# Patient Record
Sex: Female | Born: 1990
Health system: Southern US, Community
[De-identification: ages and names within clinical notes are randomized; demographics above are authoritative.]

## PROBLEM LIST (undated history)

## (undated) DIAGNOSIS — N760 Acute vaginitis: Secondary | ICD-10-CM

## (undated) DIAGNOSIS — B9689 Other specified bacterial agents as the cause of diseases classified elsewhere: Secondary | ICD-10-CM

---
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
## 2011-04-13 LAB — CBC
HCT: 41.3
Hemoglobin: 10.1 — ABNORMAL LOW
Hemoglobin: 10.2 — ABNORMAL LOW
MCHC: 35.3
MCHC: 35.3
MCV: 91.4
Platelets: 140 — ABNORMAL LOW
RBC: 3.12 — ABNORMAL LOW
RBC: 3.2 — ABNORMAL LOW
RDW: 13.2
RDW: 13.6
WBC: 13
WBC: 8.9

## 2011-04-13 LAB — RPR: RPR Ser Ql: NONREACTIVE

## 2015-12-28 DIAGNOSIS — R112 Nausea with vomiting, unspecified: Secondary | ICD-10-CM | POA: Diagnosis not present

## 2015-12-28 DIAGNOSIS — Z331 Pregnant state, incidental: Secondary | ICD-10-CM | POA: Diagnosis not present

## 2015-12-28 DIAGNOSIS — N912 Amenorrhea, unspecified: Secondary | ICD-10-CM | POA: Diagnosis not present

## 2016-01-29 MED FILL — miSOPROStol 200 MCG TABS: 200 | 2 days supply | Qty: 8 | Fill #0

## 2016-02-12 DIAGNOSIS — Z114 Encounter for screening for human immunodeficiency virus [HIV]: Secondary | ICD-10-CM | POA: Diagnosis not present

## 2016-02-12 DIAGNOSIS — Z113 Encounter for screening for infections with a predominantly sexual mode of transmission: Secondary | ICD-10-CM | POA: Diagnosis not present

## 2016-02-12 DIAGNOSIS — Z1159 Encounter for screening for other viral diseases: Secondary | ICD-10-CM | POA: Diagnosis not present

## 2016-02-12 DIAGNOSIS — Z6824 Body mass index (BMI) 24.0-24.9, adult: Secondary | ICD-10-CM | POA: Diagnosis not present

## 2016-02-12 DIAGNOSIS — Z01419 Encounter for gynecological examination (general) (routine) without abnormal findings: Secondary | ICD-10-CM | POA: Diagnosis not present

## 2016-02-12 DIAGNOSIS — R87612 Low grade squamous intraepithelial lesion on cytologic smear of cervix (LGSIL): Secondary | ICD-10-CM | POA: Diagnosis not present

## 2016-02-13 MED FILL — XULANE PATCH: 150-35 | 28 days supply | Qty: 3 | Fill #0

## 2016-03-31 MED FILL — XULANE PATCH: 150-35 | 28 days supply | Qty: 3 | Fill #1

## 2016-06-29 DIAGNOSIS — N926 Irregular menstruation, unspecified: Secondary | ICD-10-CM | POA: Diagnosis not present

## 2016-06-29 DIAGNOSIS — Z0000 Encounter for general adult medical examination without abnormal findings: Secondary | ICD-10-CM | POA: Diagnosis not present

## 2016-06-29 DIAGNOSIS — N898 Other specified noninflammatory disorders of vagina: Secondary | ICD-10-CM | POA: Diagnosis not present

## 2016-06-29 DIAGNOSIS — E559 Vitamin D deficiency, unspecified: Secondary | ICD-10-CM | POA: Diagnosis not present

## 2016-06-29 DIAGNOSIS — N6310 Unspecified lump in the right breast, unspecified quadrant: Secondary | ICD-10-CM | POA: Diagnosis not present

## 2016-06-29 DIAGNOSIS — Z114 Encounter for screening for human immunodeficiency virus [HIV]: Secondary | ICD-10-CM | POA: Diagnosis not present

## 2016-06-29 DIAGNOSIS — Z7251 High risk heterosexual behavior: Secondary | ICD-10-CM | POA: Diagnosis not present

## 2016-06-29 DIAGNOSIS — R5383 Other fatigue: Secondary | ICD-10-CM | POA: Diagnosis not present

## 2016-06-29 MED FILL — metroNIDAZOLE 500 MG TABS: 500 | 7 days supply | Qty: 14 | Fill #0

## 2016-06-30 ENCOUNTER — Other Ambulatory Visit: Payer: Self-pay | Admitting: Nurse Practitioner

## 2016-06-30 DIAGNOSIS — N6310 Unspecified lump in the right breast, unspecified quadrant: Secondary | ICD-10-CM

## 2016-07-15 ENCOUNTER — Other Ambulatory Visit: Payer: Self-pay

## 2016-07-31 ENCOUNTER — Ambulatory Visit
Admission: RE | Admit: 2016-07-31 | Discharge: 2016-07-31 | Disposition: A | Discharge status: Home / Self Care | Payer: 59 | Source: Ambulatory Visit | Attending: Nurse Practitioner | Admitting: Nurse Practitioner

## 2016-07-31 ENCOUNTER — Other Ambulatory Visit: Payer: Self-pay | Admitting: Nurse Practitioner

## 2016-07-31 DIAGNOSIS — N6310 Unspecified lump in the right breast, unspecified quadrant: Secondary | ICD-10-CM

## 2016-08-04 ENCOUNTER — Ambulatory Visit
Admission: RE | Admit: 2016-08-04 | Discharge: 2016-08-04 | Disposition: A | Discharge status: Home / Self Care | Payer: 59 | Source: Ambulatory Visit | Attending: Nurse Practitioner | Admitting: Nurse Practitioner

## 2016-08-04 ENCOUNTER — Other Ambulatory Visit: Payer: Self-pay | Admitting: Nurse Practitioner

## 2016-08-04 DIAGNOSIS — N6310 Unspecified lump in the right breast, unspecified quadrant: Secondary | ICD-10-CM | POA: Diagnosis not present

## 2016-08-04 DIAGNOSIS — D241 Benign neoplasm of right breast: Secondary | ICD-10-CM | POA: Diagnosis not present

## 2016-08-12 DIAGNOSIS — D241 Benign neoplasm of right breast: Secondary | ICD-10-CM | POA: Diagnosis not present

## 2016-08-31 DIAGNOSIS — Z3201 Encounter for pregnancy test, result positive: Secondary | ICD-10-CM | POA: Diagnosis not present

## 2016-09-04 DIAGNOSIS — Z3682 Encounter for antenatal screening for nuchal translucency: Secondary | ICD-10-CM | POA: Diagnosis not present

## 2016-09-04 DIAGNOSIS — Z3491 Encounter for supervision of normal pregnancy, unspecified, first trimester: Secondary | ICD-10-CM | POA: Diagnosis not present

## 2016-09-04 DIAGNOSIS — Z36 Encounter for antenatal screening for chromosomal anomalies: Secondary | ICD-10-CM | POA: Diagnosis not present

## 2016-09-04 DIAGNOSIS — Z3201 Encounter for pregnancy test, result positive: Secondary | ICD-10-CM | POA: Diagnosis not present

## 2016-10-15 DIAGNOSIS — O34219 Maternal care for unspecified type scar from previous cesarean delivery: Secondary | ICD-10-CM | POA: Diagnosis not present

## 2016-10-15 DIAGNOSIS — Z880 Allergy status to penicillin: Secondary | ICD-10-CM | POA: Diagnosis not present

## 2016-10-15 DIAGNOSIS — O0932 Supervision of pregnancy with insufficient antenatal care, second trimester: Secondary | ICD-10-CM | POA: Diagnosis not present

## 2016-10-15 DIAGNOSIS — Z3A18 18 weeks gestation of pregnancy: Secondary | ICD-10-CM | POA: Diagnosis not present

## 2016-10-22 DIAGNOSIS — Z3A19 19 weeks gestation of pregnancy: Secondary | ICD-10-CM | POA: Diagnosis not present

## 2016-10-22 DIAGNOSIS — O039 Complete or unspecified spontaneous abortion without complication: Secondary | ICD-10-CM | POA: Diagnosis not present

## 2016-10-22 DIAGNOSIS — O4290 Premature rupture of membranes, unspecified as to length of time between rupture and onset of labor, unspecified weeks of gestation: Secondary | ICD-10-CM | POA: Diagnosis not present

## 2016-10-22 DIAGNOSIS — Z880 Allergy status to penicillin: Secondary | ICD-10-CM | POA: Diagnosis not present

## 2016-10-23 DIAGNOSIS — O42012 Preterm premature rupture of membranes, onset of labor within 24 hours of rupture, second trimester: Secondary | ICD-10-CM | POA: Diagnosis not present

## 2016-10-23 DIAGNOSIS — O4290 Premature rupture of membranes, unspecified as to length of time between rupture and onset of labor, unspecified weeks of gestation: Secondary | ICD-10-CM | POA: Diagnosis not present

## 2016-10-23 DIAGNOSIS — O039 Complete or unspecified spontaneous abortion without complication: Secondary | ICD-10-CM | POA: Diagnosis not present

## 2016-10-23 DIAGNOSIS — Z3A19 19 weeks gestation of pregnancy: Secondary | ICD-10-CM | POA: Diagnosis not present

## 2016-10-23 DIAGNOSIS — Z880 Allergy status to penicillin: Secondary | ICD-10-CM | POA: Diagnosis not present

## 2016-10-26 MED FILL — MEDROXYPROG 150 MG/ML SYR: 150 | 84 days supply | Qty: 1 | Fill #0

## 2017-06-12 ENCOUNTER — Encounter (HOSPITAL_COMMUNITY): Payer: Self-pay | Admitting: *Deleted

## 2017-06-12 ENCOUNTER — Other Ambulatory Visit: Payer: Self-pay

## 2017-06-12 ENCOUNTER — Ambulatory Visit (HOSPITAL_COMMUNITY)
Admission: EM | Admit: 2017-06-12 | Discharge: 2017-06-12 | Disposition: A | Discharge status: Home / Self Care | Payer: 59 | Attending: Internal Medicine | Admitting: Internal Medicine

## 2017-06-12 DIAGNOSIS — N898 Other specified noninflammatory disorders of vagina: Secondary | ICD-10-CM | POA: Insufficient documentation

## 2017-06-12 DIAGNOSIS — Z711 Person with feared health complaint in whom no diagnosis is made: Secondary | ICD-10-CM

## 2017-06-12 DIAGNOSIS — Z113 Encounter for screening for infections with a predominantly sexual mode of transmission: Secondary | ICD-10-CM | POA: Diagnosis not present

## 2017-06-12 DIAGNOSIS — Z880 Allergy status to penicillin: Secondary | ICD-10-CM | POA: Insufficient documentation

## 2017-06-12 HISTORY — DX: Acute vaginitis: N76.0

## 2017-06-12 HISTORY — DX: Other specified bacterial agents as the cause of diseases classified elsewhere: B96.89

## 2017-06-12 LAB — POCT PREGNANCY, URINE: Preg Test, Ur: NEGATIVE

## 2017-06-12 MED ORDER — METRONIDAZOLE 500 MG PO TABS: 500.0000 mg | ORAL_TABLET | Freq: Two times a day (BID) | ORAL | 0 refills | Status: DC

## 2017-06-12 NOTE — ED Triage Notes (Signed)
C/O having foul odor from vagina x "months".  Reports unprotected sex "earlier this year".  C/O painful intercourse.  Requesting STD testing.

## 2017-06-12 NOTE — ED Provider Notes (Signed)
[place]    CSN: [id number] Arrival date & time: 06/12/17  1222     History   Chief Complaint Chief Complaint  Patient presents with  . Vaginal Discharge    HPI Lisa [name] is a 26 y.o. female.   Darrell presents with complaints of vaginal odor which has been present for a few months. She states it is similar to [place] she has had in the past. She had 1 sexual partner but has not had intercourse in the past 2 months. No specific known exposure to STDs but she states she is concerned of this as well. Denies abdominal pain, fevers, or visible discharge. Denies urinary symptoms. Does not know when her last period was, states she does not have them as she is on depo.   ROS per HPI.       Past Medical History:  Diagnosis Date  . BV (bacterial vaginosis)     There are no active problems to display for this patient.   History reviewed. No pertinent surgical history.  OB History    No data available       Home Medications    Prior to Admission medications   Medication Sig Start Date End Date Taking? Authorizing Provider  metroNIDAZOLE (FLAGYL) 500 MG tablet Take 1 tablet (500 mg total) by mouth 2 (two) times daily for 7 days. 06/12/17 06/19/17  Zigmund Gottron, NP    Family History No family history on file.  Social History Social History   Tobacco Use  . Smoking status: Never Smoker  Substance Use Topics  . Alcohol use: Yes    Comment: daily over past 2 months  . Drug use: No     Allergies   Penicillins   Review of Systems Review of Systems   Physical Exam Triage Vital Signs ED Triage Vitals [06/12/17 1334]  Enc Vitals Group     BP 112/75     Pulse Rate 83     Resp 16     Temp 98.5 F (36.9 C)     Temp Source Oral     SpO2 98 %     Weight      Height      Head Circumference      Peak Flow      Pain Score      Pain Loc      Pain Edu?      Excl. in [place]?    No data found.  Updated Vital Signs BP 112/75   Pulse 83    Temp 98.5 F (36.9 C) (Oral)   Resp 16   SpO2 98%   Visual Acuity Right Eye Distance:   Left Eye Distance:   Bilateral Distance:    Right Eye Near:   Left Eye Near:    Bilateral Near:     Physical Exam  Constitutional: She is oriented to person, place, and time. She appears well-developed and well-nourished. No distress.  Cardiovascular: Normal rate, regular rhythm and normal heart sounds.  Pulmonary/Chest: Effort normal and breath sounds normal.  Genitourinary: Vaginal discharge found.  Genitourinary Comments: Thin white discharge coating vulva; without pelvic tenderness; pelvic exam deferred at this time  Neurological: She is alert and oriented to person, place, and time.  Skin: Skin is warm and dry.     UC Treatments / Results  Labs (all labs ordered are listed, but only abnormal results are displayed) Labs Reviewed  POCT PREGNANCY, URINE  URINE CYTOLOGY ANCILLARY ONLY  EKG  EKG Interpretation None       Radiology No results found.  Procedures Procedures (including critical care time)  Medications Ordered in UC Medications - No data to display   Initial Impression / Assessment and Plan / UC Course  I have reviewed the triage vital signs and the nursing notes.  Pertinent labs & imaging results that were available during my care of the patient were reviewed by me and considered in my medical decision making (see chart for details).     Symptoms consistent with BV, flagyl initiated. Will notify of any positive findings and if any changes to treatment are needed. If symptoms worsen or do not improve in the next week to return to be seen or to follow up with PCP. Patient verbalized understanding and agreeable to plan.    Final Clinical Impressions(s) / UC Diagnoses   Final diagnoses:  Vaginal discharge  Concern about STD in female without diagnosis    ED Discharge Orders        Ordered    metroNIDAZOLE (FLAGYL) 500 MG tablet  2 times daily      06/12/17 1430       Controlled Substance Prescriptions [place] Controlled Substance Registry consulted? Not Applicable   Zigmund Gottron, NP 06/12/17 1435

## 2017-06-12 NOTE — Discharge Instructions (Signed)
We will notify you of any positive findings on your urine sample and if any additional treatments are indicated. Please follow up with a primary care or with health department as needed.

## 2017-06-14 LAB — URINE CYTOLOGY ANCILLARY ONLY
Chlamydia: POSITIVE — AB
NEISSERIA GONORRHEA: NEGATIVE
Trichomonas: NEGATIVE

## 2017-06-15 ENCOUNTER — Telehealth (HOSPITAL_COMMUNITY): Payer: Self-pay | Admitting: Internal Medicine

## 2017-06-15 MED ORDER — AZITHROMYCIN 500 MG PO TABS: 1000.0000 mg | ORAL_TABLET | Freq: Once | ORAL | 0 refills | Status: AC

## 2017-06-15 NOTE — Telephone Encounter (Signed)
Clinical staff, please let patient and the health department know that test for chlamydia was positive.  Rx zithromax sent to the pharmacy of record, [facility] on [address] at [address]  Please refrain from sexual intercourse for 7 days to give the medicine time to work.  Sexual partners need to be notified and tested/treated.  Condoms may reduce risk of reinfection.  Recheck for further evaluation if symptoms are not improving.   LM

## 2017-06-16 LAB — URINE CYTOLOGY ANCILLARY ONLY: CANDIDA VAGINITIS: NEGATIVE

## 2017-06-17 ENCOUNTER — Telehealth (HOSPITAL_COMMUNITY): Payer: Self-pay | Admitting: Emergency Medicine

## 2017-06-17 MED ORDER — METRONIDAZOLE 500 MG PO TABS: 500.0000 mg | ORAL_TABLET | Freq: Two times a day (BID) | ORAL | 0 refills | Status: AC

## 2017-06-17 NOTE — Telephone Encounter (Signed)
-----   Message from Sherlene Shams, MD sent at 06/15/2017  8:25 PM EST ----- Clinical staff, please let patient and the health department know that test for chlamydia was positive.  Rx zithromax sent to the pharmacy of record, [facility] [address] at [address]  Please refrain from sexual intercourse for 7 days to give the medicine time to work.  Sexual partners need to be notified and tested/treated.  Condoms may reduce risk of reinfection.  Recheck for further evaluation if symptoms are not improving.   LM

## 2017-06-17 NOTE — Telephone Encounter (Signed)
Called pt to notify of recent lab results.... Pt ID'd properly Reports she lost her medication she p/u for Flagy and wanted to know if we could call another one in Per Lanelle Bal B, NP... Ok [facility] ([address]) Also notified of pending Rx.   Adv pt if sx are not getting better to return or to f/u w/PCP Education on safe sex given Also adv pt to notify partner(s) Faxed documentation to [facility] Notified pt that lab results can be obtained through MyChart Pt verb understanding.

## 2018-02-01 ENCOUNTER — Ambulatory Visit (INDEPENDENT_AMBULATORY_CARE_PROVIDER_SITE_OTHER): Payer: 59 | Admitting: Obstetrics and Gynecology

## 2018-02-01 ENCOUNTER — Encounter: Payer: Self-pay | Admitting: Obstetrics and Gynecology

## 2018-02-01 VITALS — BP 123/77 | HR 78 | Resp 16 | Ht 65.5 in | Wt 167.2 lb

## 2018-02-01 DIAGNOSIS — Z124 Encounter for screening for malignant neoplasm of cervix: Secondary | ICD-10-CM

## 2018-02-01 DIAGNOSIS — Z3202 Encounter for pregnancy test, result negative: Secondary | ICD-10-CM

## 2018-02-01 DIAGNOSIS — N898 Other specified noninflammatory disorders of vagina: Secondary | ICD-10-CM

## 2018-02-01 DIAGNOSIS — Z3009 Encounter for other general counseling and advice on contraception: Secondary | ICD-10-CM

## 2018-02-01 DIAGNOSIS — Z3046 Encounter for surveillance of implantable subdermal contraceptive: Secondary | ICD-10-CM

## 2018-02-01 DIAGNOSIS — Z01419 Encounter for gynecological examination (general) (routine) without abnormal findings: Secondary | ICD-10-CM | POA: Diagnosis not present

## 2018-02-01 DIAGNOSIS — Z113 Encounter for screening for infections with a predominantly sexual mode of transmission: Secondary | ICD-10-CM | POA: Diagnosis not present

## 2018-02-01 LAB — POCT URINE PREGNANCY: Preg Test, Ur: NEGATIVE

## 2018-02-01 MED ORDER — ETONOGESTREL 68 MG ~~LOC~~ IMPL
68.0000 mg | DRUG_IMPLANT | Freq: Once | SUBCUTANEOUS | Status: AC
  Administered 2018-02-01: 68 mg via SUBCUTANEOUS

## 2018-02-01 NOTE — Progress Notes (Signed)
Obstetrics and Gynecology Annual Patient Evaluation  Appointment Date: 02/01/2018  OBGYN Clinic: [facility] for [facility]  Primary Care Provider: Patient, No Pcp Per  Referring Provider: No ref. provider found  Chief Complaint:  Chief Complaint  Patient presents with  . Annual Exam    W/PAP    History of Present Illness: Lisa [name] is a 27 y.o. African-American G2P2 (Patient's last menstrual period was 01/17/2018 (approximate).), seen for the above chief complaint. Her past medical history is significant for h/o PTL/PTB.  Has some vaginal dryness with intercourse. Pt hesitant to do any lubrication. Last sex prior to last period.   No breast s/s, nausea, vomiting, abdominal pain, dysuria, hematuria, vaginal itching, dyspareunia, diarrhea, constipation, blood in BMs  Review of Systems: as noted in the History of Present Illness.  There are no active problems to display for this patient.   Past Medical History:  Past Medical History:  Diagnosis Date  . BV (bacterial vaginosis)     Past Surgical History:  No past surgical history on file.  Past Obstetrical History:  OB History  Gravida Para Term Preterm AB Living  2 2       1   SAB TAB Ectopic Multiple Live Births          1    # Outcome Date GA Lbr Len/2nd Weight Sex Delivery Anes PTL Lv  2 Para           1 Para             Obstetric Comments  2009: TSVD. 2018: SVD 19wk PTL and delivery    Past Gynecological History: As per HPI. Periods: 3d, regular, qmonth, not heavy, somewhat painful History of Pap Smear(s): Yes.   Last pap unsure She is currently using nothing for contraception.   Social History:  Social History   Socioeconomic History  . Marital status: Single    Spouse name: Not on file  . Number of children: Not on file  . Years of education: Not on file  . Highest education level: Not on file  Occupational History  . Not on file  Social Needs  . Financial resource strain:  Not on file  . Food insecurity:    Worry: Not on file    Inability: Not on file  . Transportation needs:    Medical: Not on file    Non-medical: Not on file  Tobacco Use  . Smoking status: Never Smoker  . Smokeless tobacco: Never Used  Substance and Sexual Activity  . Alcohol use: Yes    Comment: daily over past 2 months  . Drug use: No  . Sexual activity: Yes    Birth control/protection: Condom    Comment: usually condoms  Lifestyle  . Physical activity:    Days per week: Not on file    Minutes per session: Not on file  . Stress: Not on file  Relationships  . Social connections:    Talks on phone: Not on file    Gets together: Not on file    Attends religious service: Not on file    Active member of club or organization: Not on file    Attends meetings of clubs or organizations: Not on file    Relationship status: Not on file  . Intimate partner violence:    Fear of current or ex partner: Not on file    Emotionally abused: Not on file    Physically abused: Not on file    Forced sexual  activity: Not on file  Other Topics Concern  . Not on file  Social History Narrative  . Not on file    Family History: She denies any female cancers   Medications None  Allergies Penicillins   Physical Exam:  BP 123/77 (BP Location: Left Arm, Patient Position: Sitting, Cuff Size: Large)   Pulse 78   Resp 16   Ht 5' 5.5" (1.664 m)   Wt 167 lb 3.2 oz (75.8 kg)   LMP 01/17/2018 (Approximate)   BMI 27.40 kg/m  Body mass index is 27.4 kg/m.  General appearance: Well nourished, well developed female in no acute distress.  Neck:  Supple, normal appearance, and no thyromegaly  Cardiovascular: normal s1 and s2.  No murmurs, rubs or gallops. Respiratory:  Clear to auscultation bilateral. Normal respiratory effort Abdomen: positive bowel sounds and no masses, hernias; diffusely non tender to palpation, non distended Breasts: breasts appear normal, no suspicious masses, no skin or  nipple changes or axillary nodes, and normal palpation. Neuro/Psych:  Normal mood and affect.  Skin:  Warm and dry.  Lymphatic:  No inguinal lymphadenopathy.   Pelvic exam: is not limited by body habitus EGBUS: within normal limits, Vagina: within normal limits and with no blood or discharge in the vault, Cervix: normal appearing cervix without tenderness, discharge or lesions. Uterus:  nonenlarged and non tender and Adnexa:  normal adnexa and no mass, fullness, tenderness Rectovaginal: deferred  Laboratory: UPT negative  Radiology: none  Assessment: pt doing well  Plan:  1. General counselling and advice on contraception D/w her re: various options and she'd like to do a LARC method and would like to nexplanon again (she's had two in the past). See procedure note for uncomplicated nexplanon placement. Pt told to consider effective in one week and told that it doesn't prevent against STIs. Also told her about different types of lubrication and olive oil.  - POCT urine pregnancy  2. Encounter for annual routine gynecological examination - HIV antibody (with reflex) - Hepatitis B Surface AntiGEN - RPR - Hepatitis C Antibody - Cytology - PAP  3. Encounter for routine examination for contraception - etonogestrel (NEXPLANON) implant 68 mg  RTC PRN  Durene Romans MD Attending Center for [facility])

## 2018-02-02 ENCOUNTER — Encounter: Payer: Self-pay | Admitting: Obstetrics and Gynecology

## 2018-02-02 LAB — HEPATITIS B SURFACE ANTIGEN: Hepatitis B Surface Ag: NEGATIVE

## 2018-02-02 LAB — RPR: RPR Ser Ql: NONREACTIVE

## 2018-02-02 LAB — HIV ANTIBODY (ROUTINE TESTING W REFLEX): HIV Screen 4th Generation wRfx: NONREACTIVE

## 2018-02-02 LAB — HEPATITIS C ANTIBODY: Hep C Virus Ab: <0.1 s/co ratio (ref 0.0–0.9)

## 2018-02-03 LAB — CYTOLOGY - PAP
Bacterial vaginitis: POSITIVE — AB
Candida vaginitis: NEGATIVE
Chlamydia: NEGATIVE
DIAGNOSIS: NEGATIVE
Neisseria Gonorrhea: NEGATIVE
Trichomonas: NEGATIVE

## 2018-02-07 ENCOUNTER — Other Ambulatory Visit: Payer: Self-pay

## 2018-02-07 MED ORDER — METRONIDAZOLE 500 MG PO TABS: 500.0000 mg | ORAL_TABLET | Freq: Two times a day (BID) | ORAL | 0 refills | Status: DC

## 2018-02-07 NOTE — Telephone Encounter (Signed)
Called patient to inform her of positive BV results and need to be treated with flagyl. Patient voice understanding at this time.

## 2018-06-28 ENCOUNTER — Ambulatory Visit (HOSPITAL_COMMUNITY)
Admission: EM | Admit: 2018-06-28 | Discharge: 2018-06-28 | Disposition: A | Discharge status: Home / Self Care | Payer: 59 | Attending: Family Medicine | Admitting: Family Medicine

## 2018-06-28 ENCOUNTER — Encounter (HOSPITAL_COMMUNITY): Payer: Self-pay | Admitting: Emergency Medicine

## 2018-06-28 ENCOUNTER — Other Ambulatory Visit: Payer: Self-pay

## 2018-06-28 DIAGNOSIS — N898 Other specified noninflammatory disorders of vagina: Secondary | ICD-10-CM | POA: Insufficient documentation

## 2018-06-28 DIAGNOSIS — Z202 Contact with and (suspected) exposure to infections with a predominantly sexual mode of transmission: Secondary | ICD-10-CM | POA: Insufficient documentation

## 2018-06-28 LAB — POCT URINALYSIS DIP (DEVICE)
BILIRUBIN URINE: NEGATIVE
Glucose, UA: NEGATIVE mg/dL
HGB URINE DIPSTICK: NEGATIVE
KETONES UR: NEGATIVE mg/dL
Leukocytes, UA: NEGATIVE
Nitrite: NEGATIVE
PH: 8.5 — AB (ref 5.0–8.0)
Protein, ur: NEGATIVE mg/dL
Specific Gravity, Urine: 1.015 (ref 1.005–1.030)
Urobilinogen, UA: 0.2 mg/dL (ref 0.0–1.0)

## 2018-06-28 LAB — POCT PREGNANCY, URINE: Preg Test, Ur: NEGATIVE

## 2018-06-28 MED ORDER — METRONIDAZOLE 500 MG PO TABS: 500.0000 mg | ORAL_TABLET | Freq: Two times a day (BID) | ORAL | 0 refills | Status: DC

## 2018-06-28 NOTE — ED Triage Notes (Signed)
PT reports she has a discharge, odor, and dysuria. Her partner called her and told her to get checked

## 2018-06-28 NOTE — ED Provider Notes (Signed)
[place]    CSN: [id number] Arrival date & time: 06/28/18  1302     History   Chief Complaint Chief Complaint  Patient presents with  . Exposure to STD    HPI Lisa [name] is a 27 y.o. female.   27 year old female comes in for STD check. States has had discharge, odor. Dysuria without frequency, hematuria. Denies abdominal pain, nausea, vomiting. Denies fever, chills, night sweats. States her partner told her to get checked, but did not tell her what was positive. LMP 06/21/2018     Past Medical History:  Diagnosis Date  . BV (bacterial vaginosis)     There are no active problems to display for this patient.   History reviewed. No pertinent surgical history.  OB History    Gravida  2   Para  2   Term      Preterm      AB      Living  1     SAB      TAB      Ectopic      Multiple      Live Births  1        Obstetric Comments  2009: TSVD. 2018: SVD 19wk PTL and delivery         Home Medications    Prior to Admission medications   Medication Sig Start Date End Date Taking? Authorizing Provider  etonogestrel (NEXPLANON) 68 MG IMPL implant 1 each by Subdermal route once.   Yes [provider]  metroNIDAZOLE (FLAGYL) 500 MG tablet Take 1 tablet (500 mg total) by mouth 2 (two) times daily. 06/28/18   Ok Edwards, PA-C    Family History No family history on file.  Social History Social History   Tobacco Use  . Smoking status: Never Smoker  . Smokeless tobacco: Never Used  Substance Use Topics  . Alcohol use: Yes    Comment: daily over past 2 months  . Drug use: No     Allergies   Penicillins   Review of Systems Review of Systems  Reason unable to perform ROS: See HPI as above.     Physical Exam Triage Vital Signs ED Triage Vitals  Enc Vitals Group     BP 06/28/18 1409 119/81     Pulse Rate 06/28/18 1409 74     Resp 06/28/18 1409 16     Temp 06/28/18 1409 97.6 F (36.4 C)     Temp Source  06/28/18 1409 Oral     SpO2 06/28/18 1409 100 %     Weight --      Height --      Head Circumference --      Peak Flow --      Pain Score 06/28/18 1406 6     Pain Loc --      Pain Edu? --      Excl. in [place]? --    No data found.  Updated Vital Signs BP 119/81   Pulse 74   Temp 97.6 F (36.4 C) (Oral)   Resp 16   LMP 06/21/2018   SpO2 100%   Physical Exam  Constitutional: She is oriented to person, place, and time. She appears well-developed and well-nourished. No distress.  HENT:  Head: Normocephalic and atraumatic.  Eyes: Pupils are equal, round, and reactive to light. Conjunctivae are normal.  Cardiovascular: Normal rate, regular rhythm and normal heart sounds. Exam reveals no gallop and no friction rub.  No murmur heard. Pulmonary/Chest: Effort normal and breath sounds normal. She has no wheezes. She has no rales.  Abdominal: Soft. Bowel sounds are normal. She exhibits no mass. There is no tenderness. There is no rebound, no guarding and no CVA tenderness.  Neurological: She is alert and oriented to person, place, and time.  Skin: Skin is warm and dry.  Psychiatric: She has a normal mood and affect. Her behavior is normal. Judgment normal.     UC Treatments / Results  Labs (all labs ordered are listed, but only abnormal results are displayed) Labs Reviewed  POCT URINALYSIS DIP (DEVICE) - Abnormal; Notable for the following components:      Result Value   pH 8.5 (*)    All other components within normal limits  POC URINE PREG, ED  POCT PREGNANCY, URINE  CERVICOVAGINAL ANCILLARY ONLY    EKG None  Radiology No results found.  Procedures Procedures (including critical care time)  Medications Ordered in UC Medications - No data to display  Initial Impression / Assessment and Plan / UC Course  I have reviewed the triage vital signs and the nursing notes.  Pertinent labs & imaging results that were available during my care of the patient were reviewed by me  and considered in my medical decision making (see chart for details).    Patient was treated empirically for BV. Flagyl as directed. Cytology sent, patient will be contacted with any positive results that require additional treatment. Patient to refrain from sexual activity for the next 7 days. Return precautions given.   Final Clinical Impressions(s) / UC Diagnoses   Final diagnoses:  Vaginal discharge    ED Prescriptions    Medication Sig Dispense Auth. Provider   metroNIDAZOLE (FLAGYL) 500 MG tablet Take 1 tablet (500 mg total) by mouth 2 (two) times daily. 14 tablet Tobin Chad, [place] 06/28/18 1437

## 2018-06-28 NOTE — Discharge Instructions (Signed)
You were treated empirically for Bv. Start flagyl as directed. Cytology sent, you will be contacted with any positive results that requires further treatment. Refrain from sexual activity and alcohol use for the next 7 days. Monitor for any worsening of symptoms, fever, abdominal pain, nausea, vomiting, to follow up for reevaluation.

## 2018-06-29 LAB — CERVICOVAGINAL ANCILLARY ONLY
Chlamydia: NEGATIVE
Neisseria Gonorrhea: NEGATIVE
Trichomonas: NEGATIVE

## 2018-08-17 ENCOUNTER — Ambulatory Visit (HOSPITAL_COMMUNITY)
Admission: EM | Admit: 2018-08-17 | Discharge: 2018-08-17 | Disposition: A | Discharge status: Home / Self Care | Payer: Self-pay | Attending: Family Medicine | Admitting: Family Medicine

## 2018-08-17 ENCOUNTER — Encounter (HOSPITAL_COMMUNITY): Payer: Self-pay | Admitting: Emergency Medicine

## 2018-08-17 DIAGNOSIS — N939 Abnormal uterine and vaginal bleeding, unspecified: Secondary | ICD-10-CM | POA: Insufficient documentation

## 2018-08-17 LAB — CBC
HCT: 41.4 % (ref 36.0–46.0)
Hemoglobin: 14 g/dL (ref 12.0–15.0)
MCH: 31.1 pg (ref 26.0–34.0)
MCHC: 33.8 g/dL (ref 30.0–36.0)
MCV: 92 fL (ref 80.0–100.0)
NRBC: 0 % (ref 0.0–0.2)
PLATELETS: 243 10*3/uL (ref 150–400)
RBC: 4.5 MIL/uL (ref 3.87–5.11)
RDW: 12.5 % (ref 11.5–15.5)
WBC: 5.1 10*3/uL (ref 4.0–10.5)

## 2018-08-17 LAB — POCT PREGNANCY, URINE: Preg Test, Ur: NEGATIVE

## 2018-08-17 MED ORDER — MEGESTROL ACETATE 40 MG PO TABS: 40.0000 mg | ORAL_TABLET | Freq: Two times a day (BID) | ORAL | 0 refills | Status: DC

## 2018-08-17 MED ORDER — METRONIDAZOLE 500 MG PO TABS: 500.0000 mg | ORAL_TABLET | Freq: Two times a day (BID) | ORAL | 0 refills | Status: AC

## 2018-08-17 NOTE — ED Triage Notes (Signed)
Pt here for vaginal bleeding and odor

## 2018-08-17 NOTE — Discharge Instructions (Signed)
We will send the swab off to check for STDs at any causes of discharge. We will also send the blood off to check for your hemoglobin to make sure that you have not lost too much blood.  Please increase the iron in your diet, see attached paper.  Please begin taking Megace twice daily until the bleeding stops, follow-up with women's clinic or your OB/GYN for further evaluation of this abnormal bleeding You may begin taking metronidazole twice daily to treat for BV   Please follow-up if bleeding still persisting, developing worsening abdominal pain, discomfort, feeling significantly lightheaded, dizzy, weak or fatigued

## 2018-08-18 LAB — CERVICOVAGINAL ANCILLARY ONLY
Bacterial vaginitis: NEGATIVE
Candida vaginitis: NEGATIVE
Chlamydia: NEGATIVE
Neisseria Gonorrhea: NEGATIVE
Trichomonas: NEGATIVE

## 2018-08-18 NOTE — ED Provider Notes (Signed)
[facility]    CSN: [id number] Arrival date & time: 08/17/18  1357     History   Chief Complaint Chief Complaint  Patient presents with  . Vaginal Bleeding    HPI Lisa [name] is a 28 y.o. female no contributing past medical history presenting today for evaluation of vaginal bleeding and odor.  Patient states that for the past 2 and half months she has had persistent vaginal bleeding.  States that flow has varied between heavy and light.  She is also noticed associated odor.  She has had some mild cramping with this provider.  Denies any nausea or vomiting.  Does state prior to onset she had some discharge.  Patient has Nexplanon and has had this in place for approximately 1-1/2 years.  She denies previous issues with extended cycles.  Denies history of fibroids or ovarian cysts.  She denies specific concerns for STDs that she was recently tested earlier in December.  She denies lightheadedness or dizziness.  She does admit to some mild weakness.  HPI  Past Medical History:  Diagnosis Date  . BV (bacterial vaginosis)     There are no active problems to display for this patient.   History reviewed. No pertinent surgical history.  OB History    Gravida  2   Para  2   Term      Preterm      AB      Living  1     SAB      TAB      Ectopic      Multiple      Live Births  1        Obstetric Comments  2009: TSVD. 2018: SVD 19wk PTL and delivery         Home Medications    Prior to Admission medications   Medication Sig Start Date End Date Taking? Authorizing Provider  etonogestrel (NEXPLANON) 68 MG IMPL implant 1 each by Subdermal route once.    [provider]  megestrol (MEGACE) 40 MG tablet Take 1 tablet (40 mg total) by mouth 2 (two) times daily. Take until bleeding stops 08/17/18   Jamiria Langill, [employer] C, PA-C  metroNIDAZOLE (FLAGYL) 500 MG tablet Take 1 tablet (500 mg total) by mouth 2 (two) times daily for 7 days. 08/17/18 08/24/18   Adalis Gatti, Elesa Hacker, PA-C    Family History Family History  Problem Relation Age of Onset  . Healthy Mother   . Healthy Father     Social History Social History   Tobacco Use  . Smoking status: Never Smoker  . Smokeless tobacco: Never Used  Substance Use Topics  . Alcohol use: Yes    Comment: daily over past 2 months  . Drug use: No     Allergies   Penicillins   Review of Systems Review of Systems  Constitutional: Negative for fever.  Respiratory: Negative for shortness of breath.   Cardiovascular: Negative for chest pain.  Gastrointestinal: Positive for abdominal pain. Negative for diarrhea, nausea and vomiting.  Genitourinary: Positive for vaginal bleeding. Negative for dysuria, flank pain, genital sores, hematuria, menstrual problem, vaginal discharge and vaginal pain.  Musculoskeletal: Negative for back pain.  Skin: Negative for rash.  Neurological: Negative for dizziness, light-headedness and headaches.     Physical Exam Triage Vital Signs ED Triage Vitals  Enc Vitals Group     BP 08/17/18 1451 118/81     Pulse Rate 08/17/18 1451 88     Resp  08/17/18 1451 18     Temp 08/17/18 1451 98.8 F (37.1 C)     Temp Source 08/17/18 1451 Oral     SpO2 08/17/18 1451 99 %     Weight --      Height --      Head Circumference --      Peak Flow --      Pain Score 08/17/18 1452 0     Pain Loc --      Pain Edu? --      Excl. in [place]? --    No data found.  Updated Vital Signs BP 118/81 (BP Location: Right Arm)   Pulse 88   Temp 98.8 F (37.1 C) (Oral)   Resp 18   SpO2 99%   Visual Acuity Right Eye Distance:   Left Eye Distance:   Bilateral Distance:    Right Eye Near:   Left Eye Near:    Bilateral Near:     Physical Exam Vitals signs and nursing note reviewed.  Constitutional:      General: She is not in acute distress.    Appearance: She is well-developed.  HENT:     Head: Normocephalic and atraumatic.  Eyes:     Conjunctiva/sclera: Conjunctivae  normal.  Neck:     Musculoskeletal: Neck supple.  Cardiovascular:     Rate and Rhythm: Normal rate and regular rhythm.     Heart sounds: No murmur.  Pulmonary:     Effort: Pulmonary effort is normal. No respiratory distress.     Breath sounds: Normal breath sounds.  Abdominal:     Palpations: Abdomen is soft.     Tenderness: There is no abdominal tenderness.     Comments: Mild lower abdominal discomfort bilaterally, negative rebound, negative Rovsing, negative McBurney's  Genitourinary:    Comments: Normal external female genitalia, no external sources of bleeding visualized, vaginal mucosa pink, moderate amount of bright red blood, seen exiting os  No masses or lesions palpated internally, no cervical motion tenderness Skin:    General: Skin is warm and dry.  Neurological:     Mental Status: She is alert.      UC Treatments / Results  Labs (all labs ordered are listed, but only abnormal results are displayed) Labs Reviewed  CBC  POC URINE PREG, ED  POCT PREGNANCY, URINE  CERVICOVAGINAL ANCILLARY ONLY    EKG None  Radiology No results found.  Procedures Procedures (including critical care time)  Medications Ordered in UC Medications - No data to display  Initial Impression / Assessment and Plan / UC Course  I have reviewed the triage vital signs and the nursing notes.  Pertinent labs & imaging results that were available during my care of the patient were reviewed by me and considered in my medical decision making (see chart for details).    Patient was extended cycle for 2 and half months, pregnancy negative, will check CBC to ensure hemoglobin stable.  Swab obtained to check for STDs as well as causes of odor.  Will initiate treatment with metronidazole today as she has had history of BV previously with similar odor.  Also will initiate Megace twice daily until bleeding stops.  Will have patient follow-up with OB/GYN for further evaluation of abnormal bleeding  and further discussion of patient's concerns regarding her birth control.  Continue to monitor,Discussed strict return precautions. Patient verbalized understanding and is agreeable with plan.  Final Clinical Impressions(s) / UC Diagnoses   Final diagnoses:  Vaginal bleeding  Discharge Instructions     We will send the swab off to check for STDs at any causes of discharge. We will also send the blood off to check for your hemoglobin to make sure that you have not lost too much blood.  Please increase the iron in your diet, see attached paper.  Please begin taking Megace twice daily until the bleeding stops, follow-up with women's clinic or your OB/GYN for further evaluation of this abnormal bleeding You may begin taking metronidazole twice daily to treat for BV   Please follow-up if bleeding still persisting, developing worsening abdominal pain, discomfort, feeling significantly lightheaded, dizzy, weak or fatigued   ED Prescriptions    Medication Sig Dispense Auth. Provider   metroNIDAZOLE (FLAGYL) 500 MG tablet Take 1 tablet (500 mg total) by mouth 2 (two) times daily for 7 days. 14 tablet Bentley Haralson C, PA-C   megestrol (MEGACE) 40 MG tablet Take 1 tablet (40 mg total) by mouth 2 (two) times daily. Take until bleeding stops 20 tablet Audre Cenci C, PA-C     Controlled Substance Prescriptions  Controlled Substance Registry consulted? Not Applicable   Janith Lima, [place] 08/18/18 1049

## 2018-08-19 ENCOUNTER — Telehealth (HOSPITAL_COMMUNITY): Payer: Self-pay | Admitting: Emergency Medicine

## 2018-08-19 NOTE — Telephone Encounter (Signed)
Attempted to contact patient about normal labs. No answer.

## 2019-02-01 ENCOUNTER — Encounter: Payer: Self-pay | Admitting: Radiology

## 2019-08-22 ENCOUNTER — Emergency Department (HOSPITAL_COMMUNITY): Payer: Self-pay

## 2019-08-22 ENCOUNTER — Other Ambulatory Visit: Payer: Self-pay

## 2019-08-22 ENCOUNTER — Emergency Department (HOSPITAL_COMMUNITY)
Admission: EM | Admit: 2019-08-22 | Discharge: 2019-08-22 | Disposition: A | Discharge status: Home / Self Care | Payer: Self-pay | Attending: Emergency Medicine | Admitting: Emergency Medicine

## 2019-08-22 ENCOUNTER — Encounter (HOSPITAL_COMMUNITY): Payer: Self-pay | Admitting: Emergency Medicine

## 2019-08-22 DIAGNOSIS — M79601 Pain in right arm: Secondary | ICD-10-CM | POA: Insufficient documentation

## 2019-08-22 MED ORDER — IBUPROFEN 800 MG PO TABS
800.0000 mg | ORAL_TABLET | Freq: Once | ORAL | Status: DC
  Filled 2019-08-22: qty 1

## 2019-08-22 NOTE — ED Triage Notes (Signed)
Patient reports pain at right upper arm injured this evening from elevator door at work , it was caught between the door when it was closing , no deformity /pain increases with movement .

## 2019-08-22 NOTE — ED Provider Notes (Signed)
[facility] EMERGENCY DEPARTMENT Provider Note   CSN: [id number] Arrival date & time: 08/22/19  0220     History Chief Complaint  Patient presents with  . Arm Pain    Lisa [name] is a 29 y.o. female.  Patient presents to the emergency department with chief complaint of right arm pain.  She states that an elevator door closed and bumped into her right shoulder.  She denies any other injuries.  Denies any numbness, weakness, or tingling.  She states that it is painful with movement.  No treatments prior to arrival.  The history is provided by the patient. No language interpreter was used.       Past Medical History:  Diagnosis Date  . BV (bacterial vaginosis)     There are no problems to display for this patient.   History reviewed. No pertinent surgical history.   OB History    Gravida  2   Para  2   Term      Preterm      AB      Living  1     SAB      TAB      Ectopic      Multiple      Live Births  1        Obstetric Comments  2009: TSVD. 2018: SVD 19wk PTL and delivery        Family History  Problem Relation Age of Onset  . Healthy Mother   . Healthy Father     Social History   Tobacco Use  . Smoking status: Never Smoker  . Smokeless tobacco: Never Used  Substance Use Topics  . Alcohol use: Yes    Comment: daily over past 2 months  . Drug use: No    Home Medications Prior to Admission medications   Medication Sig Start Date End Date Taking? Authorizing Provider  etonogestrel (NEXPLANON) 68 MG IMPL implant 1 each by Subdermal route once.    [provider]  megestrol (MEGACE) 40 MG tablet Take 1 tablet (40 mg total) by mouth 2 (two) times daily. Take until bleeding stops 08/17/18   Wieters, [place] C, PA-C    Allergies    Penicillins  Review of Systems   Review of Systems  All other systems reviewed and are negative.   Physical Exam Updated Vital Signs BP 126/74 (BP Location: Left Arm)    Pulse (!) 109   Temp 99.2 F (37.3 C) (Oral)   Resp 16   Ht 5\' 5"  (1.651 m)   Wt 72.1 kg   LMP 08/18/2019   SpO2 98%   BMI 26.46 kg/m   Physical Exam Vitals and nursing note reviewed.  Constitutional:      General: She is not in acute distress.    Appearance: She is well-developed.  HENT:     Head: Normocephalic and atraumatic.  Eyes:     Conjunctiva/sclera: Conjunctivae normal.  Cardiovascular:     Rate and Rhythm: Normal rate.     Pulses: Normal pulses.     Heart sounds: No murmur.     Comments: Normal peripheral pulses Pulmonary:     Effort: Pulmonary effort is normal. No respiratory distress.  Abdominal:     General: There is no distension.  Musculoskeletal:     Cervical back: Neck supple.     Comments: Range of motion of the right upper extremity is somewhat limited, seems to be effort dependent, compartments are soft  Mild  clavicular tenderness  Skin:    General: Skin is warm and dry.     Comments: No hematoma, no contusion  Neurological:     Mental Status: She is alert and oriented to person, place, and time.     Comments: Distal sensation intact  Psychiatric:        Mood and Affect: Mood normal.        Behavior: Behavior normal.     ED Results / Procedures / Treatments   Labs (all labs ordered are listed, but only abnormal results are displayed) Labs Reviewed - No data to display  EKG None  Radiology DG Clavicle Right  Result Date: 08/22/2019 CLINICAL DATA:  Posttraumatic right upper arm pain EXAM: RIGHT CLAVICLE - 2+ VIEWS COMPARISON:  None. FINDINGS: Small superior spur at the [facility] joint. No fracture or malalignment. Negative soft tissues. IMPRESSION: No evidence of injury. Electronically Signed   By: Monte Fantasia M.D.   On: 08/22/2019 05:04   DG Humerus Right  Result Date: 08/22/2019 CLINICAL DATA:  Right arm caught in elevator door with pain, initial encounter EXAM: RIGHT HUMERUS - 2+ VIEW COMPARISON:  None. FINDINGS: No acute fracture or  dislocation is noted. No soft tissue abnormality is noted. Degenerative changes of the acromioclavicular joint are seen. IMPRESSION: No acute abnormality noted. Electronically Signed   By: Inez Catalina M.D.   On: 08/22/2019 02:54    Procedures Procedures (including critical care time)  Medications Ordered in ED Medications - No data to display  ED Course  I have reviewed the triage vital signs and the nursing notes.  Pertinent labs & imaging results that were available during my care of the patient were reviewed by me and considered in my medical decision making (see chart for details).    MDM Rules/Calculators/A&P                      Patient with right arm injury.  An elevator door bumped into her arm.  There is no evidence of traumatic injury on my exam.  Plain films of the arm are negative for fracture.  There is some degenerative changes of the [facility] joint.  Will discharge to home with primary care follow-up.  Recommend ice, NSAIDs, and rest.   Final Clinical Impression(s) / ED Diagnoses Final diagnoses:  Pain of right upper extremity    Rx / DC Orders ED Discharge Orders    None       Montine Circle, PA-C 08/22/19 [id number]    Orpah Greek, MD 08/22/19 [phone]

## 2020-02-05 ENCOUNTER — Encounter (HOSPITAL_COMMUNITY): Payer: Self-pay

## 2020-02-05 ENCOUNTER — Other Ambulatory Visit: Payer: Self-pay

## 2020-02-05 ENCOUNTER — Ambulatory Visit (HOSPITAL_COMMUNITY)
Admission: EM | Admit: 2020-02-05 | Discharge: 2020-02-05 | Disposition: A | Discharge status: Home / Self Care | Payer: Self-pay | Attending: Emergency Medicine | Admitting: Emergency Medicine

## 2020-02-05 DIAGNOSIS — Z113 Encounter for screening for infections with a predominantly sexual mode of transmission: Secondary | ICD-10-CM | POA: Insufficient documentation

## 2020-02-05 DIAGNOSIS — N898 Other specified noninflammatory disorders of vagina: Secondary | ICD-10-CM | POA: Insufficient documentation

## 2020-02-05 LAB — HIV ANTIBODY (ROUTINE TESTING W REFLEX): HIV Screen 4th Generation wRfx: NONREACTIVE

## 2020-02-05 NOTE — ED Provider Notes (Signed)
[facility]    CSN: [id number] Arrival date & time: 02/05/20  0803      History   Chief Complaint Chief Complaint  Patient presents with   SEXUALLY TRANSMITTED DISEASE    HPI Lisa [name] is a 29 y.o. female.   Lisa [name] presents with complaints of vaginal odor and some vaginal itching which has been ongoing for the past two months, approximately. States that discharge appears normal for her. No pelvic or abdominal pain. No urinary symptoms. Denies any vulvar sores/lesions/bumps. No vaginal bleeding. Just finished menstruating. Sexually active, 1 partner in the past year. She would like to be screened for std's.    ROS per HPI, negative if not otherwise mentioned.      Past Medical History:  Diagnosis Date   BV (bacterial vaginosis)     There are no problems to display for this patient.   History reviewed. No pertinent surgical history.  OB History    Gravida  2   Para  2   Term      Preterm      AB      Living  1     SAB      TAB      Ectopic      Multiple      Live Births  1        Obstetric Comments  2009: TSVD. 2018: SVD 19wk PTL and delivery         Home Medications    Prior to Admission medications   Medication Sig Start Date End Date Taking? Authorizing Provider  etonogestrel (NEXPLANON) 68 MG IMPL implant 1 each by Subdermal route once.    [provider]  megestrol (MEGACE) 40 MG tablet Take 1 tablet (40 mg total) by mouth 2 (two) times daily. Take until bleeding stops 08/17/18   Wieters, [employer] C, PA-C    Family History Family History  Problem Relation Age of Onset   Healthy Mother    Healthy Father     Social History Social History   Tobacco Use   Smoking status: Never Smoker   Smokeless tobacco: Never Used  Substance Use Topics   Alcohol use: Yes    Comment: daily over past 2 months   Drug use: No     Allergies   Penicillins   Review of Systems Review of  Systems   Physical Exam Triage Vital Signs ED Triage Vitals [02/05/20 0813]  Enc Vitals Group     BP 118/83     Pulse Rate 76     Resp 16     Temp 98.2 F (36.8 C)     Temp Source Oral     SpO2 98 %     Weight      Height      Head Circumference      Peak Flow      Pain Score 0     Pain Loc      Pain Edu?      Excl. in [place]?    No data found.  Updated Vital Signs BP 118/83 (BP Location: Right Arm)    Pulse 76    Temp 98.2 F (36.8 C) (Oral)    Resp 16    LMP 01/30/2020 (Approximate)    SpO2 98%   Visual Acuity Right Eye Distance:   Left Eye Distance:   Bilateral Distance:    Right Eye Near:   Left Eye Near:    Bilateral Near:  Physical Exam Constitutional:      General: She is not in acute distress.    Appearance: She is well-developed.  Cardiovascular:     Rate and Rhythm: Normal rate.  Pulmonary:     Effort: Pulmonary effort is normal.  Abdominal:     Palpations: Abdomen is not rigid.     Tenderness: There is no abdominal tenderness. There is no guarding or rebound.  Genitourinary:    Comments: Denies sores, lesions, vaginal bleeding; no pelvic pain; gu exam deferred at this time, vaginal self swab collected.   Skin:    General: Skin is warm and dry.  Neurological:     Mental Status: She is alert and oriented to person, place, and time.      UC Treatments / Results  Labs (all labs ordered are listed, but only abnormal results are displayed) Labs Reviewed  RPR  HIV ANTIBODY (ROUTINE TESTING W REFLEX)  CERVICOVAGINAL ANCILLARY ONLY    EKG   Radiology No results found.  Procedures Procedures (including critical care time)  Medications Ordered in UC Medications - No data to display  Initial Impression / Assessment and Plan / UC Course  I have reviewed the triage vital signs and the nursing notes.  Pertinent labs & imaging results that were available during my care of the patient were reviewed by me and considered in my medical decision  making (see chart for details).     2 months of vaginal symptoms. Vaginal cytology collected and pending. Will await results to treat. Safe sex recommended. Patient verbalized understanding and agreeable to plan.   Final Clinical Impressions(s) / UC Diagnoses   Final diagnoses:  Vaginal discharge  Screening for STD (sexually transmitted disease)     Discharge Instructions     We will notify of you any positive findings or if any changes to treatment are needed. If normal or otherwise without concern to your results, we will not call you. Please log on to your MyChart to review your results if interested in so.   Please use condoms to prevent STD's.    ED Prescriptions    None     PDMP not reviewed this encounter.   Zigmund Gottron, NP 02/05/20 [phone]

## 2020-02-05 NOTE — ED Triage Notes (Signed)
Pt presents to UC requesting STD testing. Pt c/o of new onset foul smell and itching. Pt denies urinary frequency, burning, or lower abdominal/ flank pain. Pt denies n/v/d, fevers chills.

## 2020-02-05 NOTE — Discharge Instructions (Signed)
We will notify of you any positive findings or if any changes to treatment are needed. If normal or otherwise without concern to your results, we will not call you. Please log on to your MyChart to review your results if interested in so.   °Please use condoms to prevent STD's.  °

## 2020-02-06 ENCOUNTER — Telehealth: Payer: Self-pay

## 2020-02-06 DIAGNOSIS — N760 Acute vaginitis: Secondary | ICD-10-CM

## 2020-02-06 LAB — CERVICOVAGINAL ANCILLARY ONLY
Bacterial Vaginitis (gardnerella): POSITIVE — AB
Candida Glabrata: NEGATIVE
Candida Vaginitis: NEGATIVE
Chlamydia: NEGATIVE
Comment: NEGATIVE
Comment: NEGATIVE
Comment: NEGATIVE
Comment: NEGATIVE
Comment: NEGATIVE
Comment: NORMAL
Neisseria Gonorrhea: NEGATIVE
Trichomonas: NEGATIVE

## 2020-02-06 LAB — RPR: RPR Ser Ql: NONREACTIVE

## 2020-02-06 MED ORDER — METRONIDAZOLE 0.75 % VA GEL: 1.0000 | Freq: Two times a day (BID) | VAGINAL | 0 refills | Status: DC

## 2020-02-06 MED ORDER — METRONIDAZOLE 0.75 % VA GEL: 1.0000 | Freq: Every day | VAGINAL | 0 refills | Status: AC

## 2020-07-28 ENCOUNTER — Ambulatory Visit (HOSPITAL_COMMUNITY)
Admission: EM | Admit: 2020-07-28 | Discharge: 2020-07-28 | Disposition: A | Discharge status: Home / Self Care | Payer: Self-pay | Attending: Urgent Care | Admitting: Urgent Care

## 2020-07-28 ENCOUNTER — Encounter (HOSPITAL_COMMUNITY): Payer: Self-pay | Admitting: *Deleted

## 2020-07-28 DIAGNOSIS — N760 Acute vaginitis: Secondary | ICD-10-CM | POA: Insufficient documentation

## 2020-07-28 DIAGNOSIS — Z7251 High risk heterosexual behavior: Secondary | ICD-10-CM | POA: Insufficient documentation

## 2020-07-28 DIAGNOSIS — N898 Other specified noninflammatory disorders of vagina: Secondary | ICD-10-CM | POA: Insufficient documentation

## 2020-07-28 MED ORDER — METRONIDAZOLE 0.75 % VA GEL: 1.0000 | Freq: Two times a day (BID) | VAGINAL | 0 refills | Status: DC

## 2020-07-28 NOTE — ED Triage Notes (Signed)
Patient with history of BV, states vaginal odor started after menstrual cycle. Would like to be tested for STDs as well, is sexually active.

## 2020-07-28 NOTE — ED Provider Notes (Signed)
  [facility]   MRN: [id number] DOB: [birth year]  Subjective:   Lisa [name] is a 30 y.o. female presenting for several day history of acute onset recurrent malodorous vaginal discharge. Has a history of BV. Symptoms started after her lmp which just ended, was regular. She has Nexplanon. She is sexually active, no protection, 1 female partner. Would like to make sure she does not have STIs.   No current facility-administered medications for this encounter.  Current Outpatient Medications:  .  etonogestrel (NEXPLANON) 68 MG IMPL implant, 1 each by Subdermal route once., Disp: , Rfl:  .  megestrol (MEGACE) 40 MG tablet, Take 1 tablet (40 mg total) by mouth 2 (two) times daily. Take until bleeding stops, Disp: 20 tablet, Rfl: 0   Allergies  Allergen Reactions  . Penicillins Hives    Past Medical History:  Diagnosis Date  . BV (bacterial vaginosis)      No past surgical history on file.  Family History  Problem Relation Age of Onset  . Healthy Mother   . Healthy Father     Social History   Tobacco Use  . Smoking status: Never Smoker  . Smokeless tobacco: Never Used  Substance Use Topics  . Alcohol use: Yes    Comment: daily over past 2 months  . Drug use: No    ROS   Objective:   Vitals: BP 112/79 (BP Location: Right Arm)   Pulse 73   Temp 98.8 F (37.1 C) (Oral)   Resp 17   LMP 07/21/2020   SpO2 100%   Physical Exam Constitutional:      General: She is not in acute distress.    Appearance: Normal appearance. She is well-developed. She is not ill-appearing.  HENT:     Head: Normocephalic and atraumatic.     Nose: Nose normal.     Mouth/Throat:     Mouth: Mucous membranes are moist.     Pharynx: Oropharynx is clear.  Eyes:     General: No scleral icterus.    Extraocular Movements: Extraocular movements intact.     Pupils: Pupils are equal, round, and reactive to light.  Cardiovascular:     Rate and Rhythm: Normal rate.  Pulmonary:      Effort: Pulmonary effort is normal.  Skin:    General: Skin is warm and dry.  Neurological:     General: No focal deficit present.     Mental Status: She is alert and oriented to person, place, and time.  Psychiatric:        Mood and Affect: Mood normal.        Behavior: Behavior normal.      Assessment and Plan :   PDMP not reviewed this encounter.  1. Vaginal discharge     Will treat for recurrent BV. Labs pending. Counseled patient on potential for adverse effects with medications prescribed/recommended today, ER and return-to-clinic precautions discussed, patient verbalized understanding.    Jaynee Eagles, PA-C 07/28/20 1224

## 2020-07-30 LAB — CERVICOVAGINAL ANCILLARY ONLY
Bacterial Vaginitis (gardnerella): POSITIVE — AB
Candida Glabrata: NEGATIVE
Candida Vaginitis: NEGATIVE
Chlamydia: NEGATIVE
Comment: NEGATIVE
Comment: NEGATIVE
Comment: NEGATIVE
Comment: NEGATIVE
Comment: NEGATIVE
Comment: NORMAL
Neisseria Gonorrhea: NEGATIVE
Trichomonas: NEGATIVE

## 2020-08-11 ENCOUNTER — Telehealth (HOSPITAL_COMMUNITY): Payer: Self-pay | Admitting: Emergency Medicine

## 2020-08-11 DIAGNOSIS — B9689 Other specified bacterial agents as the cause of diseases classified elsewhere: Secondary | ICD-10-CM

## 2020-08-11 DIAGNOSIS — N760 Acute vaginitis: Secondary | ICD-10-CM

## 2020-08-11 MED ORDER — METRONIDAZOLE 0.75 % VA GEL: VAGINAL | 0 refills | Status: DC

## 2020-08-11 NOTE — Telephone Encounter (Signed)
Patient seen here on 1/9 for vaginal discharge, found to have BV.  Other STD testing was negative.  Took 1 dose of the MetroGel, then lost it.  Per staff, patient is still reporting symptoms.  Will call in a refill to [facility] in [place].  Melynda Ripple, MD.

## 2020-11-21 ENCOUNTER — Encounter (HOSPITAL_COMMUNITY): Payer: Self-pay

## 2020-11-21 ENCOUNTER — Other Ambulatory Visit: Payer: Self-pay

## 2020-11-21 ENCOUNTER — Ambulatory Visit (HOSPITAL_COMMUNITY)
Admission: EM | Admit: 2020-11-21 | Discharge: 2020-11-21 | Disposition: A | Discharge status: Home / Self Care | Payer: Self-pay | Attending: Internal Medicine | Admitting: Internal Medicine

## 2020-11-21 DIAGNOSIS — Z113 Encounter for screening for infections with a predominantly sexual mode of transmission: Secondary | ICD-10-CM | POA: Insufficient documentation

## 2020-11-21 MED ORDER — METRONIDAZOLE 0.75 % VA GEL: 1.0000 | Freq: Every day | VAGINAL | 0 refills | Status: DC

## 2020-11-21 NOTE — ED Triage Notes (Signed)
Pt reports having a red bump on the lips on the vagina  x 1 week  Pt requested STD's testing

## 2020-11-21 NOTE — Discharge Instructions (Addendum)
Safe sex practices We will call you with recommendations if labs are abnormal Return to urgent care if you have worsening symptoms

## 2020-11-21 NOTE — ED Notes (Signed)
This RN provided chaperone for Provider to complete vaginal exam.

## 2020-11-22 LAB — CERVICOVAGINAL ANCILLARY ONLY
Bacterial Vaginitis (gardnerella): POSITIVE — AB
Candida Glabrata: NEGATIVE
Candida Vaginitis: NEGATIVE
Chlamydia: NEGATIVE
Comment: NEGATIVE
Comment: NEGATIVE
Comment: NEGATIVE
Comment: NEGATIVE
Comment: NEGATIVE
Comment: NORMAL
Neisseria Gonorrhea: NEGATIVE
Trichomonas: NEGATIVE

## 2020-11-24 NOTE — ED Provider Notes (Addendum)
[facility]    CSN: [id number] Arrival date & time: 11/21/20  0823      History   Chief Complaint Chief Complaint  Patient presents with  . Exposure to STD    HPI Lisa [name] is a 30 y.o. female comes to the urgent care for STD evaluation.  Patient noticed painful bump on the left labia majora area about a week ago.  She denies history of genital herpes.  No dysuria, urgency or frequency.   Patient also complains of fishy smell with minimal vaginal discharge.  She denies douching.  No dyspareunia.  Patient has tried over-the-counter regimen for the rash and odor but she denies any improvement in her symptoms. HPI  Past Medical History:  Diagnosis Date  . BV (bacterial vaginosis)     There are no problems to display for this patient.   History reviewed. No pertinent surgical history.  OB History    Gravida  2   Para  2   Term      Preterm      AB      Living  1     SAB      IAB      Ectopic      Multiple      Live Births  1        Obstetric Comments  2009: TSVD. 2018: SVD 19wk PTL and delivery         Home Medications    Prior to Admission medications   Medication Sig Start Date End Date Taking? Authorizing Provider  etonogestrel (NEXPLANON) 68 MG IMPL implant 1 each by Subdermal route once.    [provider]  metroNIDAZOLE (METROGEL VAGINAL) 0.75 % vaginal gel Place 1 Applicatorful vaginally at bedtime. 11/21/20   LampteyMyrene Galas, MD    Family History Family History  Problem Relation Age of Onset  . Healthy Mother   . Healthy Father     Social History Social History   Tobacco Use  . Smoking status: Never Smoker  . Smokeless tobacco: Never Used  Substance Use Topics  . Alcohol use: Yes    Comment: daily over past 2 months  . Drug use: No     Allergies   Penicillins   Review of Systems Review of Systems  Gastrointestinal: Negative for abdominal pain.  Genitourinary: Positive for vaginal pain.  Negative for dysuria, frequency and urgency.     Physical Exam Triage Vital Signs ED Triage Vitals  Enc Vitals Group     BP 11/21/20 0851 117/67     Pulse Rate 11/21/20 0851 83     Resp 11/21/20 0851 17     Temp 11/21/20 0851 97.7 F (36.5 C)     Temp Source 11/21/20 0851 Oral     SpO2 11/21/20 0851 98 %     Weight --      Height --      Head Circumference --      Peak Flow --      Pain Score 11/21/20 0852 0     Pain Loc --      Pain Edu? --      Excl. in [place]? --    No data found.  Updated Vital Signs BP 117/67 (BP Location: Right Arm)   Pulse 83   Temp 97.7 F (36.5 C) (Oral)   Resp 17   LMP  (Within Weeks) Comment: 1 week  SpO2 98%   Visual Acuity Right Eye Distance:  Left Eye Distance:   Bilateral Distance:    Right Eye Near:   Left Eye Near:    Bilateral Near:     Physical Exam Vitals and nursing note reviewed.  Cardiovascular:     Rate and Rhythm: Normal rate and regular rhythm.  Pulmonary:     Breath sounds: Normal breath sounds.  Genitourinary:    General: Normal vulva.     Comments: Shallow ulceration noted on the mucosal side base of the left labia minora. Neurological:     Mental Status: She is alert.      UC Treatments / Results  Labs (all labs ordered are listed, but only abnormal results are displayed) Labs Reviewed  CERVICOVAGINAL ANCILLARY ONLY - Abnormal; Notable for the following components:      Result Value   Bacterial Vaginitis (gardnerella) Positive (*)    All other components within normal limits  HERPES SIMPLEX VIRUS(HSV) DNA BY PCR    EKG   Radiology No results found.  Procedures Procedures (including critical care time)  Medications Ordered in UC Medications - No data to display  Initial Impression / Assessment and Plan / UC Course  I have reviewed the triage vital signs and the nursing notes.  Pertinent labs & imaging results that were available during my care of the patient were reviewed by me and  considered in my medical decision making (see chart for details).     1.  Acute Vaginitis: Cervical vaginal swab for GC/chlamydia/trichomonas/vaginal yeast infection/bacterial vaginosis MetroGel intravaginally nightly for 7 days HSV PCR test has been sent We will call patient with recommendations if labs are abnormal.  Return precautions given.  Patient is advised to the abstain from sexual intercourse until test results and treatments are completed. Final Clinical Impressions(s) / UC Diagnoses   Final diagnoses:  Screen for STD (sexually transmitted disease)     Discharge Instructions     Safe sex practices We will call you with recommendations if labs are abnormal Return to urgent care if you have worsening symptoms   ED Prescriptions    Medication Sig Dispense Auth. Provider   metroNIDAZOLE (METROGEL VAGINAL) 0.75 % vaginal gel Place 1 Applicatorful vaginally at bedtime. 70 g Janete Quilling, Myrene Galas, MD     PDMP not reviewed this encounter.   Chase Picket, MD 11/24/20 1001    Chase Picket, MD 11/29/20 [phone]

## 2020-11-25 LAB — HSV DNA BY PCR (REFERENCE LAB)
HSV 1 DNA: NEGATIVE
HSV 2 DNA: NEGATIVE

## 2020-11-25 LAB — HERPES SIMPLEX VIRUS(HSV) DNA BY PCR

## 2021-06-02 IMAGING — CR DG HUMERUS 2V *R*
2 series · 2 of 2 positions shown · non-contrast
Comparison: None.

CLINICAL DATA: Right arm caught in elevator door with pain, initial
encounter

EXAM:
RIGHT HUMERUS - 2+ VIEW

[humerus ap]
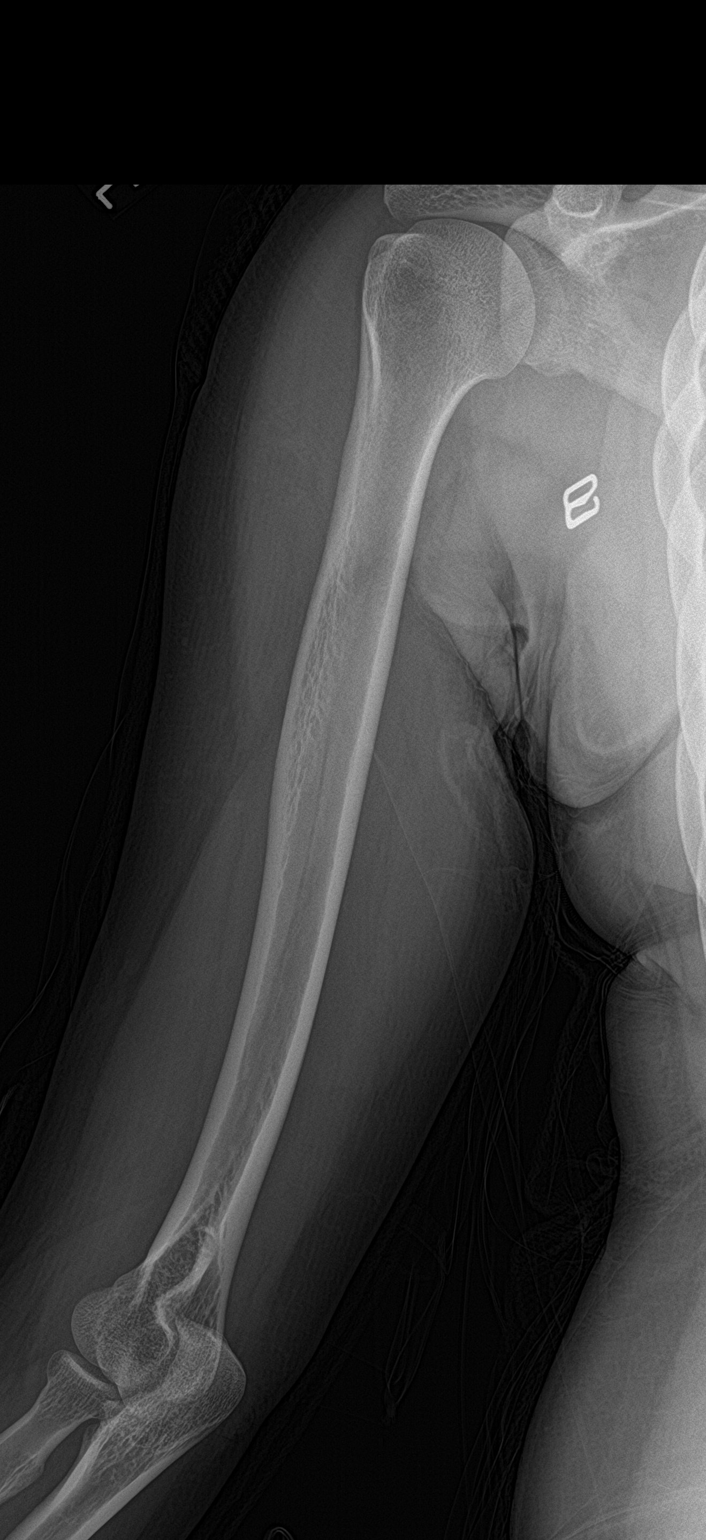

[humerus lat]
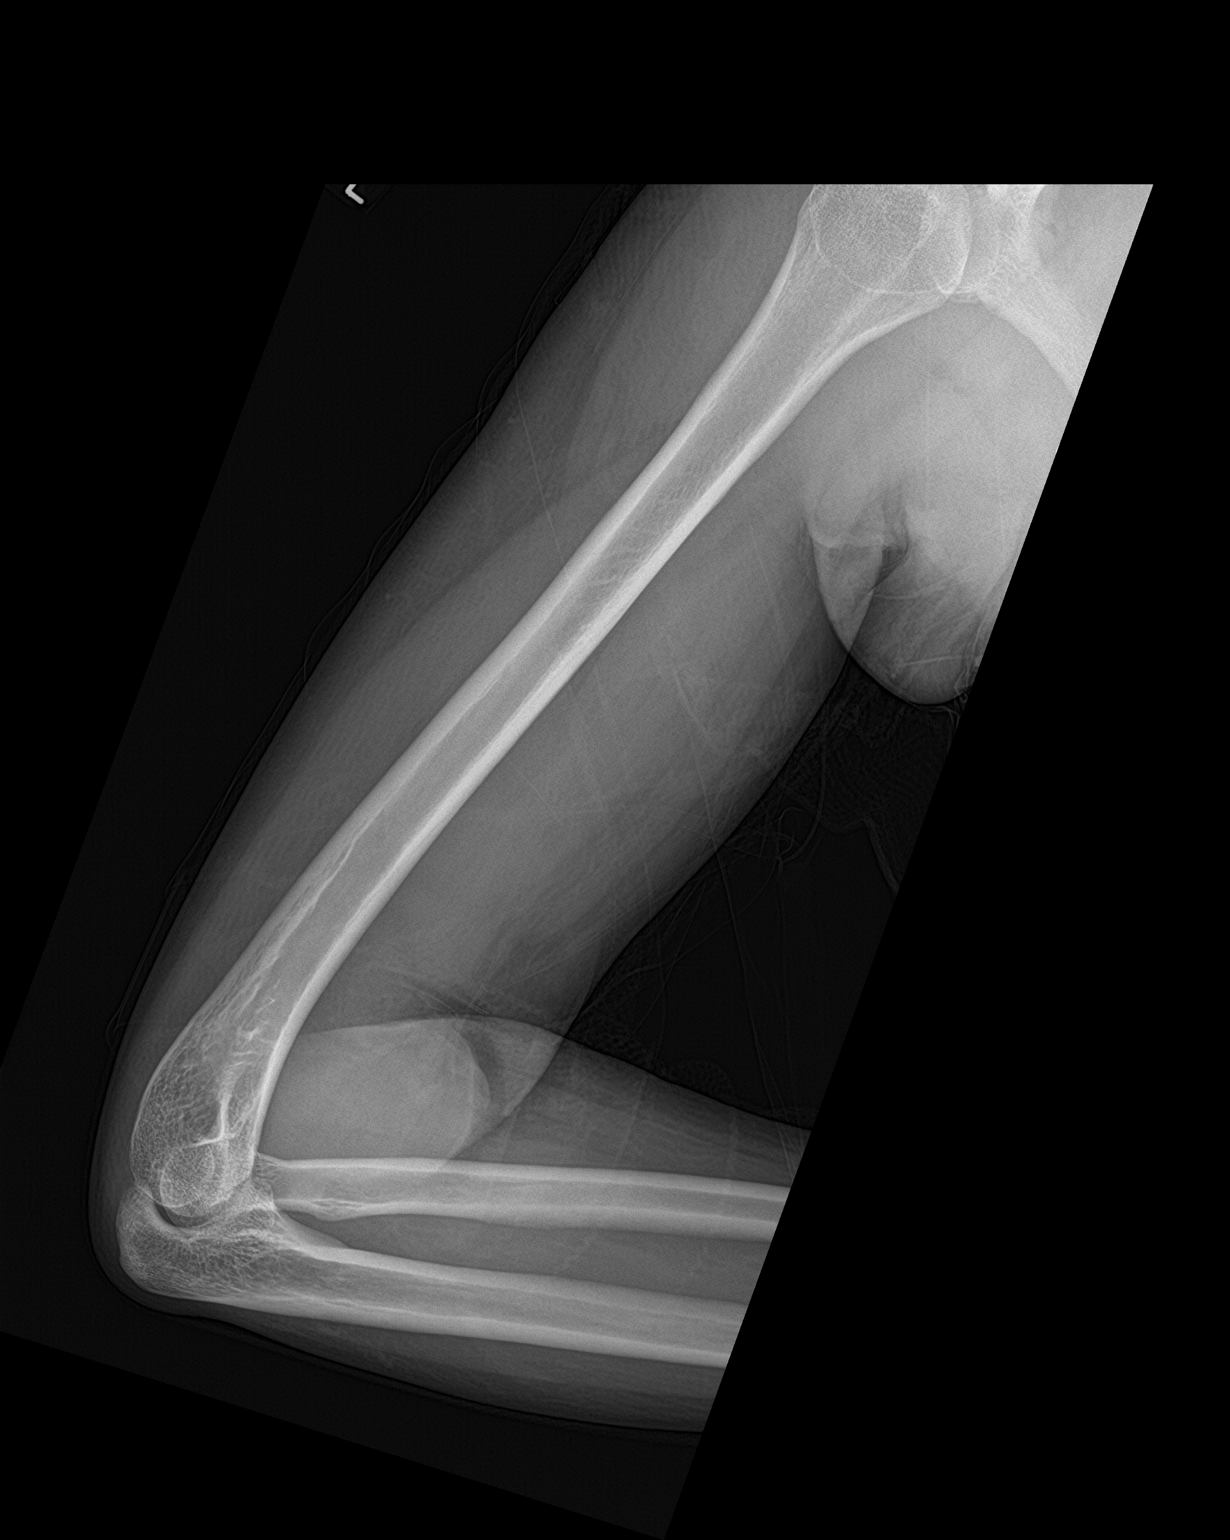

[2 of 2 positions shown; findings below may reference images not displayed]

FINDINGS: No acute fracture or dislocation is noted. No soft tissue
abnormality is noted. Degenerative changes of the acromioclavicular
joint are seen.
IMPRESSION: No acute abnormality noted.

## 2021-07-21 ENCOUNTER — Other Ambulatory Visit: Payer: Self-pay

## 2021-07-21 ENCOUNTER — Encounter (HOSPITAL_COMMUNITY): Payer: Self-pay | Admitting: Emergency Medicine

## 2021-07-21 ENCOUNTER — Emergency Department (HOSPITAL_COMMUNITY)
Admission: EM | Admit: 2021-07-21 | Discharge: 2021-07-22 | Discharge status: Against Medical Advice | Payer: PRIVATE HEALTH INSURANCE | Attending: Emergency Medicine | Admitting: Emergency Medicine

## 2021-07-21 DIAGNOSIS — L02414 Cutaneous abscess of left upper limb: Secondary | ICD-10-CM | POA: Diagnosis present

## 2021-07-21 DIAGNOSIS — Z5321 Procedure and treatment not carried out due to patient leaving prior to being seen by health care provider: Secondary | ICD-10-CM | POA: Insufficient documentation

## 2021-07-21 DIAGNOSIS — Z113 Encounter for screening for infections with a predominantly sexual mode of transmission: Secondary | ICD-10-CM | POA: Insufficient documentation

## 2021-07-21 NOTE — ED Notes (Signed)
Pt called multiple times, no answer 

## 2021-07-21 NOTE — ED Triage Notes (Signed)
Pt from home compliant of abscess under left arm approx 2 weeks.

## 2021-07-21 NOTE — ED Provider Notes (Signed)
Emergency Medicine Provider Triage Evaluation Note  Connie Lasater , a 31 y.o. female  was evaluated in triage.  Pt complains of an abscess localized to the left arm pit. Has been worsening over the last two weeks. History of similar in the past. No hx of diabetes. No fever or chills.   Review of Systems  Positive:  Negative: See above   Physical Exam  BP 118/86    Pulse (!) 106    Temp 99.2 F (37.3 C) (Oral)    Resp 17    SpO2 98%  Gen:   Awake, no distress   Resp:  Normal effort  MSK:   Moves extremities without difficulty  Other:  3cm area of induration and fluctuance over the left axilla.   Medical Decision Making  Medically screening exam initiated at 4:31 PM.  Appropriate orders placed.  Claryssa Clausen was informed that the remainder of the evaluation will be completed by another provider, this initial triage assessment does not replace that evaluation, and the importance of remaining in the ED until their evaluation is complete.     Hendricks Limes, PA-C 07/21/21 1633    Jeanell Sparrow, DO 07/22/21 [phone]

## 2021-07-22 ENCOUNTER — Ambulatory Visit (HOSPITAL_COMMUNITY)
Admission: EM | Admit: 2021-07-22 | Discharge: 2021-07-22 | Disposition: A | Discharge status: Home / Self Care | Payer: PRIVATE HEALTH INSURANCE | Source: Home / Self Care | Attending: Emergency Medicine | Admitting: Emergency Medicine

## 2021-07-22 ENCOUNTER — Other Ambulatory Visit: Payer: Self-pay

## 2021-07-22 ENCOUNTER — Encounter (HOSPITAL_COMMUNITY): Payer: Self-pay

## 2021-07-22 DIAGNOSIS — Z113 Encounter for screening for infections with a predominantly sexual mode of transmission: Secondary | ICD-10-CM | POA: Insufficient documentation

## 2021-07-22 DIAGNOSIS — L02412 Cutaneous abscess of left axilla: Secondary | ICD-10-CM | POA: Diagnosis not present

## 2021-07-22 DIAGNOSIS — L02419 Cutaneous abscess of limb, unspecified: Secondary | ICD-10-CM | POA: Insufficient documentation

## 2021-07-22 MED ORDER — DOXYCYCLINE HYCLATE 100 MG PO CAPS: 100.0000 mg | ORAL_CAPSULE | Freq: Two times a day (BID) | ORAL | 0 refills | Status: AC

## 2021-07-22 MED ORDER — IBUPROFEN 600 MG PO TABS: 600.0000 mg | ORAL_TABLET | Freq: Four times a day (QID) | ORAL | 0 refills | Status: AC | PRN

## 2021-07-22 NOTE — ED Provider Notes (Signed)
HPI  SUBJECTIVE:  Lisa [name] is a 31 y.o. female who presents with 2 issues: First, she reports a painful mass of increasing size in the left axilla starting 2 weeks ago.  No fevers, drainage, body aches, trauma to the area.  She does not shave her axilla.  No antipyretic in the past 6 hours.  She tried warm compresses without improvement her symptoms.  Symptoms are worse with palpation and washing herself.  Was seen in the ED last night, left after triage.   Second, she would like to be checked for STDs.  She states that she just finished menses.  She is completely asymptomatic.  No GU, urinary complaints, abdominal, back, pelvic pain.  She has a single female partner, she is not sure about him.  She would like to be tested for BV as well.  Past medical history negative for MRSA, here adenitis suppurativa, STDs, yeast, diabetes.  She has had BV before.  LMP: 12/30.  Denies possibility being pregnant.  PCP: [facility]  Past Medical History:  Diagnosis Date   BV (bacterial vaginosis)     History reviewed. No pertinent surgical history.  Family History  Problem Relation Age of Onset   Healthy Mother    Healthy Father     Social History   Tobacco Use   Smoking status: Never   Smokeless tobacco: Never  Substance Use Topics   Alcohol use: Yes    Comment: daily over past 2 months   Drug use: No    No current facility-administered medications for this encounter.  Current Outpatient Medications:    doxycycline (VIBRAMYCIN) 100 MG capsule, Take 1 capsule (100 mg total) by mouth 2 (two) times daily for 5 days., Disp: 10 capsule, Rfl: 0   ibuprofen (ADVIL) 600 MG tablet, Take 1 tablet (600 mg total) by mouth every 6 (six) hours as needed., Disp: 30 tablet, Rfl: 0   etonogestrel (NEXPLANON) 68 MG IMPL implant, 1 each by Subdermal route once., Disp: , Rfl:   Allergies  Allergen Reactions   Penicillins Hives     ROS  As noted in HPI.   Physical Exam  BP 121/70 (BP  Location: Right Arm)    Pulse 89    Resp 19    LMP 07/18/2021 (Exact Date)    SpO2 99%   Constitutional: Well developed, well nourished, no acute distress Eyes:  EOMI, conjunctiva normal bilaterally HENT: Normocephalic, atraumatic,mucus membranes moist Respiratory: Normal inspiratory effort Cardiovascular: Normal rate GI: nondistended skin: Tender erythematous mass with induration with central fluctuance left axilla Musculoskeletal: no deformities Neurologic: Alert & oriented x 3, no focal neuro deficits Psychiatric: Speech and behavior appropriate   ED Course   Medications - No data to display  Orders Placed This Encounter  Procedures   Aerobic Culture w Gram Stain (superficial specimen)    Standing Status:   Standing    Number of Occurrences:   1   Apply dressing    Standing Status:   Standing    Number of Occurrences:   1    No results found for this or any previous visit (from the past 24 hour(s)). No results found.  ED Clinical Impression  1. Axillary abscess   2. Screening for STDs (sexually transmitted diseases)     ED Assessment/Plan  1.  Left axillary abscess.  Performed I&D today.  Home with Tylenol/ibuprofen doxycycline.  Return here in 2 days for wound check, sooner if she gets worse.  Procedure note.  Cleaned area  with chlorhexidine soap and water several times.  Then used alcohol.  Used 0.5 cc of 1% lidocaine with epinephrine via local infiltration with adequate anesthesia.  Made a single stab incision with an 11 blade, drained a moderate amount of purulent bloody material.  Then explored wound to break up loculations.  Culture sent.  Packing placed.  Dressing placed.  Patient tolerated procedure well.  2.  STD screening.  Patient is asymptomatic.  Would like to be checked for BV in addition to STDs although discussed with patient that there are no clear indications for treatment of asymptomatic BV in the absence of being pregnant.  Swab sent.  Will base  further treatment off of labs.  Follow-up with PMD as needed.  Discussed medical decision making, treatment plan and plan for follow-up with patient.  She agrees with plan  Meds ordered this encounter  Medications   ibuprofen (ADVIL) 600 MG tablet    Sig: Take 1 tablet (600 mg total) by mouth every 6 (six) hours as needed.    Dispense:  30 tablet    Refill:  0   doxycycline (VIBRAMYCIN) 100 MG capsule    Sig: Take 1 capsule (100 mg total) by mouth 2 (two) times daily for 5 days.    Dispense:  10 capsule    Refill:  0    *This clinic note was created using [employer]. Therefore, there may be occasional mistakes despite careful proofreading.  ?     Melynda Ripple, MD 07/23/21 2031

## 2021-07-22 NOTE — ED Triage Notes (Signed)
Pt presents with an abscess under her L arm. States she wants an STD screening.

## 2021-07-22 NOTE — Discharge Instructions (Addendum)
Return here or follow up with your doctor in 2 days for a wound check. Give [place] a working phone number so that we contact you if we need to change your antibiotics. Take the medication as written. Take 1 gram of tylenol with the motrin up to 4 times a day as needed for pain and fever. This is an effective combination for pain. Return to the ER if you get worse, have a persistent fever >100.4, or for any concerns.   We will contact you if you need any further treatment based off of your STD screening labs.  Go to www.goodrx.com  or www.costplusdrugs.com to look up your medications. This will give you a list of where you can find your prescriptions at the most affordable prices. Or ask the pharmacist what the cash price is, or if they have any other discount programs available to help make your medication more affordable. This can be less expensive than what you would pay with insurance.

## 2021-07-23 LAB — CERVICOVAGINAL ANCILLARY ONLY
Bacterial Vaginitis (gardnerella): POSITIVE — AB
Candida Glabrata: NEGATIVE
Candida Vaginitis: NEGATIVE
Chlamydia: NEGATIVE
Comment: NEGATIVE
Comment: NEGATIVE
Comment: NEGATIVE
Comment: NEGATIVE
Comment: NEGATIVE
Comment: NORMAL
Neisseria Gonorrhea: NEGATIVE
Trichomonas: NEGATIVE

## 2021-07-25 ENCOUNTER — Telehealth (HOSPITAL_COMMUNITY): Payer: Self-pay | Admitting: Emergency Medicine

## 2021-07-25 MED ORDER — METRONIDAZOLE 500 MG PO TABS: 500.0000 mg | ORAL_TABLET | Freq: Two times a day (BID) | ORAL | 0 refills | Status: DC

## 2021-07-26 LAB — AEROBIC CULTURE W GRAM STAIN (SUPERFICIAL SPECIMEN)

## 2021-07-28 ENCOUNTER — Ambulatory Visit (HOSPITAL_COMMUNITY)
Admission: EM | Admit: 2021-07-28 | Discharge: 2021-07-28 | Disposition: A | Discharge status: Home / Self Care | Payer: PRIVATE HEALTH INSURANCE | Attending: Family Medicine | Admitting: Family Medicine

## 2021-07-28 ENCOUNTER — Other Ambulatory Visit: Payer: Self-pay

## 2021-07-28 ENCOUNTER — Encounter (HOSPITAL_COMMUNITY): Payer: Self-pay | Admitting: *Deleted

## 2021-07-28 DIAGNOSIS — B9689 Other specified bacterial agents as the cause of diseases classified elsewhere: Secondary | ICD-10-CM | POA: Diagnosis not present

## 2021-07-28 DIAGNOSIS — L02412 Cutaneous abscess of left axilla: Secondary | ICD-10-CM | POA: Diagnosis not present

## 2021-07-28 DIAGNOSIS — N760 Acute vaginitis: Secondary | ICD-10-CM

## 2021-07-28 DIAGNOSIS — Z5189 Encounter for other specified aftercare: Secondary | ICD-10-CM

## 2021-07-28 MED ORDER — METRONIDAZOLE 0.75 % VA GEL: 1.0000 | Freq: Every day | VAGINAL | 0 refills | Status: AC

## 2021-07-28 NOTE — ED Provider Notes (Signed)
[facility]    CSN: [id number] Arrival date & time: 07/28/21  0801      History   Chief Complaint Chief Complaint  Patient presents with   Wound Check    HPI Lisa [name] is a 31 y.o. female.    Wound Check  Here for wound check/packing removal. Had left axillary abscess drained on 1/3. Just now getting back to have packing removed. Is taking her antibiotics. No f/c.   Also received call that she had BV, but prefers to take the vag cream instead of the pills.  Past Medical History:  Diagnosis Date   BV (bacterial vaginosis)     There are no problems to display for this patient.   History reviewed. No pertinent surgical history.  OB History     Gravida  2   Para  2   Term      Preterm      AB      Living  1      SAB      IAB      Ectopic      Multiple      Live Births  1        Obstetric Comments  2009: TSVD. 2018: SVD 19wk PTL and delivery          Home Medications    Prior to Admission medications   Medication Sig Start Date End Date Taking? Authorizing Provider  metroNIDAZOLE (METROGEL VAGINAL) 0.75 % vaginal gel Place 1 Applicatorful vaginally at bedtime for 5 days. 07/28/21 08/02/21 Yes Barrett Henle, MD  etonogestrel (NEXPLANON) 68 MG IMPL implant 1 each by Subdermal route once.    [provider]  ibuprofen (ADVIL) 600 MG tablet Take 1 tablet (600 mg total) by mouth every 6 (six) hours as needed. 07/22/21   Melynda Ripple, MD    Family History Family History  Problem Relation Age of Onset   Healthy Mother    Healthy Father     Social History Social History   Tobacco Use   Smoking status: Never   Smokeless tobacco: Never  Substance Use Topics   Alcohol use: Yes    Comment: daily over past 2 months   Drug use: No     Allergies   Penicillins   Review of Systems Review of Systems   Physical Exam Triage Vital Signs ED Triage Vitals  Enc Vitals Group     BP 07/28/21 0819 116/74      Pulse Rate 07/28/21 0819 91     Resp 07/28/21 0819 18     Temp 07/28/21 0819 99 F (37.2 C)     Temp src --      SpO2 07/28/21 0819 94 %     Weight --      Height --      Head Circumference --      Peak Flow --      Pain Score 07/28/21 0816 2     Pain Loc --      Pain Edu? --      Excl. in [place]? --    No data found.  Updated Vital Signs BP 116/74    Pulse 91    Temp 99 F (37.2 C)    Resp 18    LMP 07/18/2021 (Exact Date)    SpO2 94%   Visual Acuity Right Eye Distance:   Left Eye Distance:   Bilateral Distance:    Right Eye Near:   Left Eye  Near:    Bilateral Near:     Physical Exam Constitutional:      General: She is not in acute distress.    Appearance: She is not toxic-appearing.  Skin:    Comments: Left axilla is healing with a little induration still. Packing removed easily (pt had requested being numbed up, but was not needed).Still a little dc/blood after packing removal.  Neurological:     Mental Status: She is alert and oriented to person, place, and time.  Psychiatric:        Behavior: Behavior normal.     UC Treatments / Results  Labs (all labs ordered are listed, but only abnormal results are displayed) Labs Reviewed - No data to display  EKG   Radiology No results found.  Procedures Procedures (including critical care time)  Medications Ordered in UC Medications - No data to display  Initial Impression / Assessment and Plan / UC Course  I have reviewed the triage vital signs and the nursing notes.  Pertinent labs & imaging results that were available during my care of the patient were reviewed by me and considered in my medical decision making (see chart for details).     Finish doxy. Given different metrogel topical for the BV Final Clinical Impressions(s) / UC Diagnoses   Final diagnoses:  Visit for wound check  Bacterial vaginosis     Discharge Instructions      Use the metrogel in the vaginal area at bedtime nightly for  5 days.  Clean the wound in your armpit 2 times daily with warm soapy water or peroxide. It should continue to drain a little for the next 2-3 days. If it becomes more painful again or is worsening, come back     ED Prescriptions     Medication Sig Dispense Auth. Provider   metroNIDAZOLE (METROGEL VAGINAL) 0.75 % vaginal gel Place 1 Applicatorful vaginally at bedtime for 5 days. 50 g Barrett Henle, MD      PDMP not reviewed this encounter.   Barrett Henle, MD 07/28/21 [id number]

## 2021-07-28 NOTE — ED Triage Notes (Signed)
T presents today for packing removal from wound Lt axilla. Pt reports she wants to be numbed before packing is removed.

## 2021-07-28 NOTE — Discharge Instructions (Addendum)
Use the metrogel in the vaginal area at bedtime nightly for 5 days.  Clean the wound in your armpit 2 times daily with warm soapy water or peroxide. It should continue to drain a little for the next 2-3 days. If it becomes more painful again or is worsening, come back
